# Patient Record
Sex: Female | Born: 1995 | Race: White | Hispanic: No | Marital: Single | Smoking: Never smoker
Health system: Southern US, Community
[De-identification: ages and names within clinical notes are randomized; demographics above are authoritative.]

## PROBLEM LIST (undated history)

## (undated) DIAGNOSIS — J302 Other seasonal allergic rhinitis: Secondary | ICD-10-CM

## (undated) DIAGNOSIS — J45909 Unspecified asthma, uncomplicated: Secondary | ICD-10-CM

## (undated) HISTORY — PX: NASAL SINUS SURGERY: SHX719

---
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
## 2020-09-19 ENCOUNTER — Encounter (HOSPITAL_BASED_OUTPATIENT_CLINIC_OR_DEPARTMENT_OTHER): Payer: Self-pay | Admitting: Emergency Medicine

## 2020-09-19 ENCOUNTER — Other Ambulatory Visit: Payer: Self-pay

## 2020-09-19 ENCOUNTER — Emergency Department (HOSPITAL_BASED_OUTPATIENT_CLINIC_OR_DEPARTMENT_OTHER)
Admission: EM | Admit: 2020-09-19 | Discharge: 2020-09-19 | Disposition: A | Discharge status: Home / Self Care | Payer: 59 | Attending: Emergency Medicine | Admitting: Emergency Medicine

## 2020-09-19 DIAGNOSIS — R42 Dizziness and giddiness: Secondary | ICD-10-CM | POA: Diagnosis not present

## 2020-09-19 LAB — URINALYSIS, ROUTINE W REFLEX MICROSCOPIC
Bilirubin Urine: NEGATIVE
Glucose, UA: NEGATIVE mg/dL
Hgb urine dipstick: NEGATIVE
Ketones, ur: NEGATIVE mg/dL
Leukocytes,Ua: NEGATIVE
Nitrite: NEGATIVE
Protein, ur: NEGATIVE mg/dL
Specific Gravity, Urine: 1.016 (ref 1.005–1.030)
pH: 6.5 (ref 5.0–8.0)

## 2020-09-19 LAB — BASIC METABOLIC PANEL
Anion gap: 8 (ref 5–15)
BUN: 10 mg/dL (ref 6–20)
CO2: 27 mmol/L (ref 22–32)
Calcium: 9.8 mg/dL (ref 8.9–10.3)
Chloride: 105 mmol/L (ref 98–111)
Creatinine, Ser: 0.74 mg/dL (ref 0.44–1.00)
GFR, Estimated: >60 mL/min (ref >60)
Glucose, Bld: 96 mg/dL (ref 70–99)
Potassium: 3.7 mmol/L (ref 3.5–5.1)
Sodium: 140 mmol/L (ref 135–145)

## 2020-09-19 LAB — CBC WITH DIFFERENTIAL/PLATELET
Abs Immature Granulocytes: 0.03 10*3/uL (ref 0.00–0.07)
Basophils Absolute: 0 10*3/uL (ref 0.0–0.1)
Basophils Relative: 0 %
Eosinophils Absolute: 0.2 10*3/uL (ref 0.0–0.5)
Eosinophils Relative: 2 %
HCT: 39.9 % (ref 36.0–46.0)
Hemoglobin: 13.7 g/dL (ref 12.0–15.0)
Immature Granulocytes: 0 %
Lymphocytes Relative: 26 %
Lymphs Abs: 2.7 10*3/uL (ref 0.7–4.0)
MCH: 31.6 pg (ref 26.0–34.0)
MCHC: 34.3 g/dL (ref 30.0–36.0)
MCV: 92.1 fL (ref 80.0–100.0)
Monocytes Absolute: 0.7 10*3/uL (ref 0.1–1.0)
Monocytes Relative: 7 %
Neutro Abs: 6.7 10*3/uL (ref 1.7–7.7)
Neutrophils Relative %: 65 %
Platelets: 353 10*3/uL (ref 150–400)
RBC: 4.33 MIL/uL (ref 3.87–5.11)
RDW: 12.7 % (ref 11.5–15.5)
WBC: 10.4 10*3/uL (ref 4.0–10.5)
nRBC: 0 % (ref 0.0–0.2)

## 2020-09-19 LAB — PREGNANCY, URINE: Preg Test, Ur: NEGATIVE

## 2020-09-19 NOTE — ED Triage Notes (Signed)
Pt arrives to ED with c/o of dizziness x4 days. Pt reports the dizziness is intermittent and last 5-10 minutes but happens multiple times a day. Reported L ear otalgia last week. No vision issues or weakness.

## 2020-09-19 NOTE — Discharge Instructions (Addendum)
Call your primary care doctor or specialist as discussed in the next 2-3 days.   Return immediately back to the ER if:  Your symptoms worsen within the next 12-24 hours. You develop new symptoms such as new fevers, persistent vomiting, new pain, shortness of breath, or new weakness or numbness, or if you have any other concerns.  

## 2020-09-19 NOTE — ED Provider Notes (Signed)
[facility] EMERGENCY DEPT Provider Note   CSN: [id number] Arrival date & time: 09/19/20  1814     History Chief Complaint  Patient presents with   Dizziness    Betty Sullivan is a 25 y.o. female.  Patient presents chief complaints of episodes of dizziness ongoing for the past 4 days.  She states that dizziness episodes last anywhere from to 5 minutes but they happen several times throughout the day.  Currently denies any dizziness symptoms but she had a just prior to being evaluated by myself in the ER.  She denies any ear pain.  Denies any fevers or cough or vomiting or diarrhea.  Denies any falls or other trauma.  Denies headache or back pain.      Past Medical History:  Diagnosis Date   Asthma    Seasonal allergies     There are no problems to display for this patient.   Past Surgical History:  Procedure Laterality Date   NASAL SINUS SURGERY       OB History   No obstetric history on file.     History reviewed. No pertinent family history.  Social History   Tobacco Use   Smoking status: Never   Smokeless tobacco: Never  Substance Use Topics   Alcohol use: Yes   Drug use: No    Home Medications Prior to Admission medications   Medication Sig Start Date End Date Taking? Authorizing Provider  albuterol (PROVENTIL HFA;VENTOLIN HFA) 108 (90 BASE) MCG/ACT inhaler Inhale 2 puffs into the lungs every 6 (six) hours as needed for wheezing.    [provider]    Allergies    Bee venom and Guiadrine [guaifenesin]  Review of Systems   Review of Systems  Constitutional:  Negative for fever.  HENT:  Negative for ear pain.   Eyes:  Negative for pain.  Respiratory:  Negative for cough.   Cardiovascular:  Negative for chest pain.  Gastrointestinal:  Negative for abdominal pain.  Genitourinary:  Negative for flank pain.  Musculoskeletal:  Negative for back pain.  Skin:  Negative for rash.  Neurological:  Positive for  light-headedness. Negative for headaches.   Physical Exam Updated Vital Signs BP 118/79 (BP Location: Left Arm)   Pulse 62   Temp 98.5 F (36.9 C) (Temporal)   Resp 18   Ht 5\' 7"  (1.702 m)   Wt 104.3 kg   SpO2 99%   BMI 36.02 kg/m   Physical Exam Constitutional:      General: She is not in acute distress.    Appearance: Normal appearance.  HENT:     Head: Normocephalic.     Nose: Nose normal.  Eyes:     Extraocular Movements: Extraocular movements intact.  Cardiovascular:     Rate and Rhythm: Normal rate.  Pulmonary:     Effort: Pulmonary effort is normal.  Musculoskeletal:        General: Normal range of motion.     Cervical back: Normal range of motion.  Neurological:     General: No focal deficit present.     Mental Status: She is alert and oriented to person, place, and time. Mental status is at baseline.     Cranial Nerves: No cranial nerve deficit.     Motor: No weakness.     Gait: Gait normal.    ED Results / Procedures / Treatments   Labs (all labs ordered are listed, but only abnormal results are displayed) Labs Reviewed  CBC WITH DIFFERENTIAL/PLATELET  BASIC METABOLIC PANEL  URINALYSIS, ROUTINE W REFLEX MICROSCOPIC  PREGNANCY, URINE    EKG None  Radiology No results found.  Procedures Procedures   Medications Ordered in ED Medications - No data to display  ED Course  I have reviewed the triage vital signs and the nursing notes.  Pertinent labs & imaging results that were available during my care of the patient were reviewed by me and considered in my medical decision making (see chart for details).    MDM Rules/Calculators/A&P                          Patient has a normal neuro exam.  Normal hints exam.  Strength is 5/5 all extremities and gait currently is normal without any unsteadiness.  Etiology of her symptoms unclear.  Labs are sent these are unremarkable no evidence of severe anemia or leukocytosis chemistry appears  unremarkable as well.  Patient observed in the ER for several hours continues to be symptomatic, discharged home in stable condition, advised follow-up with her primary care doctor within the week, advised immediate return for worsening symptoms pain or any additional concerns.  Final Clinical Impression(s) / ED Diagnoses Final diagnoses:  Dizziness    Rx / DC Orders ED Discharge Orders     None        Cheryll Cockayne, MD 09/19/20 2115
